# Patient Record
Sex: Female | Born: 1985 | Race: Black or African American | Hispanic: No | Marital: Single | Smoking: Never smoker
Health system: Southern US, Community
[De-identification: ages and names within clinical notes are randomized; demographics above are authoritative.]

## PROBLEM LIST (undated history)

## (undated) HISTORY — PX: NO PAST SURGERIES: SHX2092

---

## 2014-10-11 LAB — HM PAP SMEAR: HM Pap smear: NORMAL

## 2015-07-04 ENCOUNTER — Ambulatory Visit
Admission: RE | Admit: 2015-07-04 | Discharge: 2015-07-04 | Disposition: A | Discharge status: Home / Self Care | Payer: No Typology Code available for payment source | Source: Ambulatory Visit | Attending: Infectious Disease | Admitting: Infectious Disease

## 2015-07-04 ENCOUNTER — Other Ambulatory Visit: Payer: Self-pay | Admitting: Infectious Disease

## 2015-07-04 DIAGNOSIS — Z111 Encounter for screening for respiratory tuberculosis: Secondary | ICD-10-CM

## 2015-07-15 ENCOUNTER — Encounter: Payer: Self-pay | Admitting: Behavioral Health

## 2015-07-15 ENCOUNTER — Telehealth: Payer: Self-pay | Admitting: Behavioral Health

## 2015-07-15 NOTE — Telephone Encounter (Signed)
Pre-Visit Call completed with patient and chart updated.   Pre-Visit Info documented in Specialty Comments under [employer].    

## 2015-07-16 ENCOUNTER — Ambulatory Visit (HOSPITAL_BASED_OUTPATIENT_CLINIC_OR_DEPARTMENT_OTHER)
Admission: RE | Admit: 2015-07-16 | Discharge: 2015-07-16 | Disposition: A | Discharge status: Home / Self Care | Payer: BC Managed Care – PPO | Source: Ambulatory Visit | Attending: Physician Assistant | Admitting: Physician Assistant

## 2015-07-16 ENCOUNTER — Ambulatory Visit (INDEPENDENT_AMBULATORY_CARE_PROVIDER_SITE_OTHER): Payer: BC Managed Care – PPO | Admitting: Physician Assistant

## 2015-07-16 ENCOUNTER — Encounter: Payer: Self-pay | Admitting: Physician Assistant

## 2015-07-16 VITALS — BP 110/80 | HR 103 | Temp 99.9°F | Resp 16 | Ht 64.25 in | Wt 146.6 lb

## 2015-07-16 DIAGNOSIS — J189 Pneumonia, unspecified organism: Secondary | ICD-10-CM

## 2015-07-16 DIAGNOSIS — R0789 Other chest pain: Secondary | ICD-10-CM

## 2015-07-16 DIAGNOSIS — M67912 Unspecified disorder of synovium and tendon, left shoulder: Secondary | ICD-10-CM

## 2015-07-16 DIAGNOSIS — R079 Chest pain, unspecified: Secondary | ICD-10-CM | POA: Diagnosis present

## 2015-07-16 DIAGNOSIS — R509 Fever, unspecified: Secondary | ICD-10-CM

## 2015-07-16 LAB — POCT INFLUENZA A/B
INFLUENZA A, POC: NEGATIVE
INFLUENZA B, POC: NEGATIVE

## 2015-07-16 MED ORDER — DOXYCYCLINE HYCLATE 100 MG PO CAPS: 100.0000 mg | ORAL_CAPSULE | Freq: Two times a day (BID) | ORAL | Status: DC

## 2015-07-16 MED ORDER — HYDROCODONE-HOMATROPINE 5-1.5 MG/5ML PO SYRP: 5.0000 mL | ORAL_SOLUTION | Freq: Three times a day (TID) | ORAL | Status: DC | PRN

## 2015-07-16 MED ORDER — MELOXICAM 15 MG PO TABS: 15.0000 mg | ORAL_TABLET | Freq: Every day | ORAL | Status: DC
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
## 2015-07-16 NOTE — Patient Instructions (Signed)
Please go downstairs for imaging. I will call you with your results.  Stay well hydrated and get plenty of rest.  Salt water gargles and saline nasal spray will be beneficial. Take antibiotic as directed. Use Tussionex as directed for cough. Return to clinic if symptoms are not resolving.  The mobic given will help with fever as well as your shoulder.  Take daily with food over the next week. Limit heavy lifting.  Call if symptoms are not continuing to improve.

## 2015-07-16 NOTE — Progress Notes (Signed)
Pre visit review using our clinic review tool, if applicable. No additional management support is needed unless otherwise documented below in the visit note. 

## 2015-07-17 ENCOUNTER — Telehealth: Payer: Self-pay

## 2015-07-17 ENCOUNTER — Other Ambulatory Visit: Payer: Self-pay | Admitting: Physician Assistant

## 2015-07-17 MED ORDER — CLARITHROMYCIN 250 MG PO TABS: 250.0000 mg | ORAL_TABLET | Freq: Two times a day (BID) | ORAL | Status: DC

## 2015-07-17 NOTE — Telephone Encounter (Signed)
Called patient with results of XR. Advised of Pneumonia diagnosis. Advised Biaxin 250 mg will be called in to her pharmacy and to stop Doxycicline. Patient agreed. Patient would like to know if she needs to be out of work for a few more days. Biaxin ordered.

## 2015-07-17 NOTE — Telephone Encounter (Signed)
I would recommend she stay out the rest of the week to let her body heal. Ok to write note for her.

## 2015-07-17 NOTE — Telephone Encounter (Signed)
Patient informed work note ready for pick up.

## 2015-07-21 DIAGNOSIS — J189 Pneumonia, unspecified organism: Secondary | ICD-10-CM | POA: Insufficient documentation

## 2015-07-21 DIAGNOSIS — M67919 Unspecified disorder of synovium and tendon, unspecified shoulder: Secondary | ICD-10-CM | POA: Insufficient documentation

## 2015-07-21 NOTE — Assessment & Plan Note (Signed)
CXR obtained to to severity of symptoms and concerning lung exam. X-ray positive for CAP. Rx Biaxin 250 mg BID x 7 days. Increase fluids. Rx Hycodan syrup. Follow-up 1 week.
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
## 2015-07-25 ENCOUNTER — Encounter: Payer: Self-pay | Admitting: Physician Assistant

## 2015-07-25 ENCOUNTER — Ambulatory Visit (INDEPENDENT_AMBULATORY_CARE_PROVIDER_SITE_OTHER): Payer: BC Managed Care – PPO | Admitting: Physician Assistant

## 2015-07-25 VITALS — BP 110/70 | HR 55 | Temp 98.3°F | Resp 16 | Ht 64.0 in | Wt 148.1 lb

## 2015-07-25 DIAGNOSIS — J189 Pneumonia, unspecified organism: Secondary | ICD-10-CM | POA: Diagnosis not present

## 2015-07-25 MED ORDER — CETIRIZINE HCL 10 MG PO TABS: 10.0000 mg | ORAL_TABLET | Freq: Every day | ORAL | Status: DC

## 2015-07-25 NOTE — Assessment & Plan Note (Signed)
Resolved clinically. Encouraged continuation of supportive measures. Alarm signs/symptoms discussed with patient. Follow-up PRN if any symptoms recur.
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
## 2015-07-25 NOTE — Progress Notes (Signed)
Pre visit review using our clinic review tool, if applicable. No additional management support is needed unless otherwise documented below in the visit note/SLS  

## 2015-07-25 NOTE — Patient Instructions (Signed)
Please stay well hydrated and get plenty of rest. Resume a zyrtec nightly to help with allergies. Your lung exam is great so the pneumonia has clinically resolved. No need for follow-up as long as you are doing well.  If you notice any return of fever or productive cough, please come see [place] immediately.

## 2016-02-03 ENCOUNTER — Encounter: Payer: Self-pay | Admitting: Physician Assistant

## 2016-02-03 ENCOUNTER — Ambulatory Visit (INDEPENDENT_AMBULATORY_CARE_PROVIDER_SITE_OTHER): Payer: BC Managed Care – PPO | Admitting: Physician Assistant

## 2016-02-03 VITALS — BP 108/70 | HR 61 | Temp 97.9°F | Ht 64.0 in | Wt 142.8 lb

## 2016-02-03 DIAGNOSIS — K219 Gastro-esophageal reflux disease without esophagitis: Secondary | ICD-10-CM | POA: Diagnosis not present

## 2016-02-03 NOTE — Progress Notes (Signed)
Pre visit review using our clinic review tool, if applicable. No additional management support is needed unless otherwise documented below in the visit note. 

## 2016-02-03 NOTE — Patient Instructions (Signed)
Please continue Prilosec daily. Increase fluids and follow the diet below. You will be contacted by GI for further assessment of these chronic symptoms.  Food Choices for Gastroesophageal Reflux Disease, Adult When you have gastroesophageal reflux disease (GERD), the foods you eat and your eating habits are very important. Choosing the right foods can help ease the discomfort of GERD. WHAT GENERAL GUIDELINES DO I NEED TO FOLLOW?  Choose fruits, vegetables, whole grains, low-fat dairy products, and low-fat meat, fish, and poultry.  Limit fats such as oils, salad dressings, butter, nuts, and avocado.  Keep a food diary to identify foods that cause symptoms.  Avoid foods that cause reflux. These may be different for different people.  Eat frequent small meals instead of three large meals each day.  Eat your meals slowly, in a relaxed setting.  Limit fried foods.  Cook foods using methods other than frying.  Avoid drinking alcohol.  Avoid drinking large amounts of liquids with your meals.  Avoid bending over or lying down until 2-3 hours after eating. WHAT FOODS ARE NOT RECOMMENDED? The following are some foods and drinks that may worsen your symptoms: Vegetables Tomatoes. Tomato juice. Tomato and spaghetti sauce. Chili peppers. Onion and garlic. Horseradish. Fruits Oranges, grapefruit, and lemon (fruit and juice). Meats High-fat meats, fish, and poultry. This includes hot dogs, ribs, ham, sausage, salami, and bacon. Dairy Whole milk and chocolate milk. Sour cream. Cream. Butter. Ice cream. Cream cheese.  Beverages Coffee and tea, with or without caffeine. Carbonated beverages or energy drinks. Condiments Hot sauce. Barbecue sauce.  Sweets/Desserts Chocolate and cocoa. Donuts. Peppermint and spearmint. Fats and Oils High-fat foods, including [place] fries and potato chips. Other Vinegar. Strong spices, such as black pepper, white pepper, red pepper, cayenne, curry powder,  cloves, ginger, and chili powder. The items listed above may not be a complete list of foods and beverages to avoid. Contact your dietitian for more information.   This information is not intended to replace advice given to you by your health care provider. Make sure you discuss any questions you have with your health care provider.   Document Released: 09/27/2005 Document Revised: 10/18/2014 Document Reviewed: 08/01/2013 Elsevier Interactive Patient Education [employer].
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
## 2016-02-11 ENCOUNTER — Telehealth: Payer: Self-pay | Admitting: Physician Assistant

## 2016-02-11 NOTE — Telephone Encounter (Signed)
Received from [facility] forward 16 pages to Raiford Noble PA

## 2016-02-24 ENCOUNTER — Encounter: Payer: Self-pay | Admitting: Physician Assistant

## 2016-02-25 ENCOUNTER — Other Ambulatory Visit: Payer: Self-pay | Admitting: Physician Assistant

## 2016-02-25 MED ORDER — OMEPRAZOLE 40 MG PO CPDR: 40.0000 mg | DELAYED_RELEASE_CAPSULE | Freq: Every day | ORAL | Status: DC

## 2016-03-26 NOTE — Telephone Encounter (Signed)
Pt called in to follow up on paperwork. She says that she intended for records to be sent to [facility] instead of her PCP. Pt is requesting a FU for confirmation .     CB: [phone]

## 2016-03-31 NOTE — Telephone Encounter (Signed)
I have not seen these records.

## 2016-04-14 ENCOUNTER — Telehealth: Payer: Self-pay | Admitting: Gastroenterology

## 2016-04-14 NOTE — Telephone Encounter (Signed)
Received GI records and placed on Dr. Doyne Keel desk for review. Patient states that the only reason why she is transferring is that she would like all of her doctors to be under one "umbrella"

## 2016-04-20 ENCOUNTER — Encounter: Payer: Self-pay | Admitting: Gastroenterology

## 2016-04-20 ENCOUNTER — Telehealth: Payer: Self-pay | Admitting: Gastroenterology

## 2016-04-20 NOTE — Telephone Encounter (Signed)
Rec'd from [facility] forwarded 3 pages to Dr. Havery Moros

## 2016-04-20 NOTE — Telephone Encounter (Signed)
Dr. Armbruster reviewed records and has accepted patient. Ok to schedule OV. Appointment scheduled. ° °

## 2016-06-29 ENCOUNTER — Encounter: Payer: Self-pay | Admitting: Gastroenterology

## 2016-06-29 ENCOUNTER — Ambulatory Visit: Payer: BC Managed Care – PPO | Admitting: Gastroenterology

## 2016-06-29 ENCOUNTER — Ambulatory Visit (INDEPENDENT_AMBULATORY_CARE_PROVIDER_SITE_OTHER): Payer: BC Managed Care – PPO | Admitting: Gastroenterology

## 2016-06-29 VITALS — BP 106/62 | HR 58 | Ht 63.5 in | Wt 145.0 lb

## 2016-06-29 DIAGNOSIS — R140 Abdominal distension (gaseous): Secondary | ICD-10-CM | POA: Diagnosis not present

## 2016-06-29 DIAGNOSIS — R1084 Generalized abdominal pain: Secondary | ICD-10-CM

## 2016-06-29 MED ORDER — DICYCLOMINE HCL 10 MG PO CAPS: 10.0000 mg | ORAL_CAPSULE | Freq: Three times a day (TID) | ORAL | 0 refills | Status: DC | PRN
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
## 2016-06-29 NOTE — Patient Instructions (Signed)
If you are age 28 or older, your body mass index should be between 23-30. Your Body mass index is 25.28 kg/m. If this is out of the aforementioned range listed, please consider follow up with your Primary Care Provider.  If you are age 71 or younger, your body mass index should be between 19-25. Your Body mass index is 25.28 kg/m. If this is out of the aformentioned range listed, please consider follow up with your Primary Care Provider.   We have sent the following medications to your pharmacy for you to pick up at your convenience: Bentyl 10mg  1-2 tablets every 8 hours as needed.  We have given you a copy of the low FODMAP diet.

## 2016-07-07 ENCOUNTER — Telehealth: Payer: Self-pay | Admitting: Gastroenterology

## 2016-07-07 NOTE — Telephone Encounter (Signed)
Labs from prior workup received:  Fecal occult blood negative - 01/08/16 Celiac panel negative - 01/05/16  Patient should carry out plan as previously discussed, and follow up as needed if symptoms persist.

## 2016-09-21 ENCOUNTER — Ambulatory Visit (INDEPENDENT_AMBULATORY_CARE_PROVIDER_SITE_OTHER): Payer: BC Managed Care – PPO | Admitting: Family Medicine

## 2016-09-21 ENCOUNTER — Encounter: Payer: Self-pay | Admitting: Family Medicine

## 2016-09-21 VITALS — BP 120/78 | HR 68 | Temp 98.0°F | Resp 16 | Ht 63.5 in | Wt 143.4 lb

## 2016-09-21 DIAGNOSIS — J111 Influenza due to unidentified influenza virus with other respiratory manifestations: Secondary | ICD-10-CM | POA: Diagnosis not present

## 2016-09-21 DIAGNOSIS — R1084 Generalized abdominal pain: Secondary | ICD-10-CM

## 2016-09-21 LAB — POCT URINALYSIS DIPSTICK
Bilirubin, UA: NEGATIVE
Glucose, UA: NEGATIVE
KETONES UA: NEGATIVE
Leukocytes, UA: NEGATIVE
Nitrite, UA: NEGATIVE
PROTEIN UA: NEGATIVE
RBC UA: NEGATIVE
SPEC GRAV UA: 1.015
Urobilinogen, UA: 0.2
pH, UA: 6.5

## 2016-09-21 MED ORDER — OSELTAMIVIR PHOSPHATE 75 MG PO CAPS: 75.0000 mg | ORAL_CAPSULE | Freq: Two times a day (BID) | ORAL | 0 refills | Status: DC

## 2016-09-21 MED FILL — OSELTAMIVIR PHOS 75 MG CAP: 75 | 5 days supply | Qty: 10 | Fill #0

## 2016-09-21 NOTE — Patient Instructions (Signed)
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
## 2016-09-22 ENCOUNTER — Ambulatory Visit: Payer: BC Managed Care – PPO | Admitting: Medical

## 2016-09-22 LAB — COMPREHENSIVE METABOLIC PANEL
ALT: 12 U/L (ref 0–35)
AST: 19 U/L (ref 0–37)
Albumin: 4.2 g/dL (ref 3.5–5.2)
Alkaline Phosphatase: 71 U/L (ref 39–117)
BILIRUBIN TOTAL: 0.4 mg/dL (ref 0.2–1.2)
BUN: 10 mg/dL (ref 6–23)
CO2: 30 mEq/L (ref 19–32)
CREATININE: 0.72 mg/dL (ref 0.40–1.20)
Calcium: 9.5 mg/dL (ref 8.4–10.5)
Chloride: 102 mEq/L (ref 96–112)
GFR: 124 mL/min (ref >60.00)
Glucose, Bld: 74 mg/dL (ref 70–99)
Potassium: 4.2 mEq/L (ref 3.5–5.1)
SODIUM: 138 meq/L (ref 135–145)
Total Protein: 7.3 g/dL (ref 6.0–8.3)

## 2016-09-22 LAB — CBC WITH DIFFERENTIAL/PLATELET
BASOS ABS: 0 10*3/uL (ref 0.0–0.1)
Basophils Relative: 0.6 % (ref 0.0–3.0)
EOS ABS: 0.1 10*3/uL (ref 0.0–0.7)
Eosinophils Relative: 1.4 % (ref 0.0–5.0)
HEMATOCRIT: 38.9 % (ref 36.0–46.0)
HEMOGLOBIN: 13.3 g/dL (ref 12.0–15.0)
LYMPHS PCT: 24.3 % (ref 12.0–46.0)
Lymphs Abs: 1.2 10*3/uL (ref 0.7–4.0)
MCHC: 34.2 g/dL (ref 30.0–36.0)
MCV: 89.8 fl (ref 78.0–100.0)
Monocytes Absolute: 0.6 10*3/uL (ref 0.1–1.0)
Monocytes Relative: 13.6 % — ABNORMAL HIGH (ref 3.0–12.0)
Neutro Abs: 2.9 10*3/uL (ref 1.4–7.7)
Neutrophils Relative %: 60.1 % (ref 43.0–77.0)
Platelets: 236 10*3/uL (ref 150.0–400.0)
RBC: 4.33 Mil/uL (ref 3.87–5.11)
RDW: 13.3 % (ref 11.5–15.5)
WBC: 4.8 10*3/uL (ref 4.0–10.5)

## 2016-11-09 ENCOUNTER — Ambulatory Visit: Payer: BC Managed Care – PPO | Admitting: Gastroenterology

## 2016-12-20 ENCOUNTER — Telehealth: Payer: Self-pay | Admitting: Physician Assistant

## 2016-12-20 NOTE — Telephone Encounter (Signed)
Caller name: Relationship to patient: Can be reached: Pharmacy:  Reason for call: Patient has a list of shots she needs prior to going out of the country. Needs to know which ones she can get here, how much they cost, and which ones she needs  to go to the health dept to get. Yellow Fever Hep A&B Meningitis Rabies Malaria Typhoid Fever MMR Polio

## 2016-12-20 NOTE — Telephone Encounter (Signed)
Would recommend we refer her to the [facility] clinic as they can determine what is actually necessary for where she will be going and the environment she will be in. They would be able to give all of the necessary vaccines, whereas we would only have some available to [place].   Please give her the number so she can get set up with them.

## 2016-12-21 ENCOUNTER — Encounter: Payer: Self-pay | Admitting: Emergency Medicine

## 2016-12-21 NOTE — Telephone Encounter (Signed)
LMOVM and sent My chart message as well with travel advice.

## 2017-03-08 ENCOUNTER — Telehealth: Payer: Self-pay | Admitting: Physician Assistant

## 2017-03-08 DIAGNOSIS — Z7184 Encounter for health counseling related to travel: Secondary | ICD-10-CM

## 2017-03-08 NOTE — Telephone Encounter (Signed)
Referral has been faxed, confirmation received.  Patient has been called and notified that referral was faxed.

## 2017-03-08 NOTE — Telephone Encounter (Signed)
This must be a new protocol as this has never been needed before.  Referral placed for patient. Please fax and let patient know. Thank you!

## 2017-03-08 NOTE — Telephone Encounter (Signed)
Patient is currently at the health department trying to get immunizations for her upcoming travel out of the country.  She is being advised she can not get the immunization there without a referral from her pcp.  Please enter referral and I will fax it to them.    [phone] Attn:  International Travel Nurse

## 2017-05-20 ENCOUNTER — Ambulatory Visit (INDEPENDENT_AMBULATORY_CARE_PROVIDER_SITE_OTHER): Payer: BC Managed Care – PPO | Admitting: Family

## 2017-05-20 ENCOUNTER — Encounter: Payer: Self-pay | Admitting: Family

## 2017-05-20 VITALS — BP 110/67 | HR 62 | Temp 98.1°F | Resp 16 | Ht 63.5 in | Wt 151.2 lb

## 2017-05-20 DIAGNOSIS — M25562 Pain in left knee: Secondary | ICD-10-CM | POA: Diagnosis not present

## 2017-05-20 MED ORDER — MELOXICAM 7.5 MG PO TABS: 7.5000 mg | ORAL_TABLET | Freq: Every day | ORAL | 0 refills | Status: DC

## 2017-05-20 NOTE — Patient Instructions (Signed)
Please begin meloxicam once daily for your knee pain. Call if your pain worsens or if pain does not improve in 2 weeks and we will refer you to sports medicine.
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
## 2017-05-31 ENCOUNTER — Encounter: Payer: Self-pay | Admitting: Family

## 2017-05-31 DIAGNOSIS — M25562 Pain in left knee: Secondary | ICD-10-CM

## 2017-06-01 NOTE — Telephone Encounter (Signed)
Ok to refer to sports medicine per Melissa's note. TY.

## 2017-06-03 ENCOUNTER — Ambulatory Visit (INDEPENDENT_AMBULATORY_CARE_PROVIDER_SITE_OTHER): Payer: BC Managed Care – PPO | Admitting: Family Medicine

## 2017-06-03 ENCOUNTER — Encounter: Payer: Self-pay | Admitting: Family Medicine

## 2017-06-03 DIAGNOSIS — M25562 Pain in left knee: Secondary | ICD-10-CM | POA: Diagnosis not present

## 2017-06-03 NOTE — Patient Instructions (Signed)
You have popliteus spasms/strain. Tylenol, aleve, or ibuprofen only if needed. Heat 15 minutes at a time 3-4 times a day may help. A compression sleeve for the knee may help also. Knee locking/unlocking, Leg curls, hamstring swings, running lunges - add 2 pound weight with time if these are too easy. 3 sets of 10 once or twice a day. Consider physical therapy as well. Follow up with me in 6 weeks or as needed.Marland Kitchen

## 2017-06-04 DIAGNOSIS — M25562 Pain in left knee: Secondary | ICD-10-CM | POA: Insufficient documentation

## 2017-06-04 NOTE — Assessment & Plan Note (Signed)
Exam and ultrasound reassuring.  Consistent with popliteus strain/spasms.  Tylenol, aleve or ibuprofen if needed.  Heat, compression sleeve.  Shown home exercises to do daily.  Consider physical therapy if not improving as expected.  Follow up in 6 weeks or prn.
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
## 2017-07-14 ENCOUNTER — Ambulatory Visit: Payer: BC Managed Care – PPO | Admitting: Family Medicine

## 2017-08-11 ENCOUNTER — Ambulatory Visit: Payer: BC Managed Care – PPO | Admitting: Family Medicine

## 2017-08-22 ENCOUNTER — Ambulatory Visit: Payer: BC Managed Care – PPO | Admitting: Family Medicine

## 2017-08-22 ENCOUNTER — Encounter: Payer: Self-pay | Admitting: Family Medicine

## 2017-08-22 DIAGNOSIS — M25562 Pain in left knee: Secondary | ICD-10-CM

## 2017-08-22 NOTE — Patient Instructions (Signed)
Your knee pain is either due to popliteus spasms or a small meniscus tear. Look into how much it would cost you to have an MRI and let us know if you want to go ahead with this or if you want to do physical therapy.
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
## 2017-08-22 NOTE — Assessment & Plan Note (Signed)
patient has gone 2 1/2 months without improvement of knee pain.  Reports odd sensation in knee like it may buckle at times.  Exam is reassuring but discussed small meniscus tear is a possibility vs popliteus spasms which I would expect to be improved by now.  Plan to go ahead with MRI but she will check with her insurance about this first and let us know if she wants to go ahead.  Tylenol, ibuprofen, home exercises in meantime.

## 2017-09-21 ENCOUNTER — Ambulatory Visit: Payer: BC Managed Care – PPO | Admitting: Family

## 2017-09-21 ENCOUNTER — Encounter: Payer: Self-pay | Admitting: Family

## 2017-09-21 VITALS — BP 116/65 | HR 63 | Temp 98.5°F | Resp 16 | Ht 63.5 in | Wt 152.0 lb

## 2017-09-21 DIAGNOSIS — J309 Allergic rhinitis, unspecified: Secondary | ICD-10-CM | POA: Diagnosis not present

## 2017-09-21 NOTE — Patient Instructions (Signed)
Please add claritin or zyrtec once daily. Add mucinex 600mg  twice daily. Call if new/worsening symptoms or if not improved in 2-3 days.
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
## 2018-03-01 ENCOUNTER — Encounter: Payer: Self-pay | Admitting: Physician Assistant

## 2018-04-28 ENCOUNTER — Telehealth: Payer: Self-pay | Admitting: *Deleted

## 2018-04-28 NOTE — Telephone Encounter (Signed)
Ok with me 

## 2018-04-28 NOTE — Telephone Encounter (Signed)
Copied from [place] [phone]. Topic: General - Other >> Apr 28, 2018  9:32 AM Yvette Rack wrote: Reason for CRM: pt would like to switch providers from [facility] to [facility] she states that its closer to her home

## 2018-05-03 NOTE — Telephone Encounter (Signed)
Alexis Brooks -- it is possible that a cpe can be done at that visit but I do not guarantee that to the pt. If the pt has no concerns at all and is otherwise healthy; no chronic medical conditions or acute concerns that need to be addressed, then a cpe may be performed at the initial visit. If you can let the pt know please. Thanks!

## 2018-05-03 NOTE — Telephone Encounter (Signed)
Patient is requesting CPE, schedule for TOC on 07/03/18, ok for CPE at that visit?

## 2018-07-03 ENCOUNTER — Other Ambulatory Visit (HOSPITAL_COMMUNITY)
Admission: RE | Admit: 2018-07-03 | Discharge: 2018-07-03 | Disposition: A | Discharge status: Home / Self Care | Payer: BC Managed Care – PPO | Source: Ambulatory Visit | Attending: Family | Admitting: Family

## 2018-07-03 ENCOUNTER — Encounter: Payer: Self-pay | Admitting: Family

## 2018-07-03 ENCOUNTER — Ambulatory Visit (INDEPENDENT_AMBULATORY_CARE_PROVIDER_SITE_OTHER): Payer: BC Managed Care – PPO | Admitting: Family

## 2018-07-03 VITALS — BP 100/72 | HR 62 | Temp 98.5°F | Resp 16 | Ht 64.0 in | Wt 156.6 lb

## 2018-07-03 DIAGNOSIS — Z0000 Encounter for general adult medical examination without abnormal findings: Secondary | ICD-10-CM | POA: Insufficient documentation

## 2018-07-03 MED ORDER — FEXOFENADINE HCL 180 MG PO TABS: 180.0000 mg | ORAL_TABLET | Freq: Every day | ORAL | 11 refills | Status: AC

## 2018-07-03 NOTE — Patient Instructions (Addendum)
Continue healthy diet, exercise and weight loss efforts.  Please complete lab work prior to leaving.
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
## 2018-07-04 LAB — BASIC METABOLIC PANEL
BUN: 13 mg/dL (ref 6–23)
CO2: 30 meq/L (ref 19–32)
Calcium: 9.3 mg/dL (ref 8.4–10.5)
Chloride: 100 mEq/L (ref 96–112)
Creatinine, Ser: 0.77 mg/dL (ref 0.40–1.20)
GFR: 113.33 mL/min (ref >60.00)
GLUCOSE: 84 mg/dL (ref 70–99)
POTASSIUM: 4.3 meq/L (ref 3.5–5.1)
SODIUM: 137 meq/L (ref 135–145)

## 2018-07-04 LAB — URINALYSIS, ROUTINE W REFLEX MICROSCOPIC
Bilirubin Urine: NEGATIVE
Hgb urine dipstick: NEGATIVE
Ketones, ur: NEGATIVE
LEUKOCYTES UA: NEGATIVE
Nitrite: NEGATIVE
RBC / HPF: NONE SEEN (ref 0–?)
SPECIFIC GRAVITY, URINE: 1.01 (ref 1.000–1.030)
TOTAL PROTEIN, URINE-UPE24: NEGATIVE
Urine Glucose: NEGATIVE
Urobilinogen, UA: 0.2 (ref 0.0–1.0)
WBC, UA: NONE SEEN (ref 0–?)
pH: 7 (ref 5.0–8.0)

## 2018-07-04 LAB — CBC WITH DIFFERENTIAL/PLATELET
Basophils Absolute: 0.1 10*3/uL (ref 0.0–0.1)
Basophils Relative: 1.3 % (ref 0.0–3.0)
EOS PCT: 3.7 % (ref 0.0–5.0)
Eosinophils Absolute: 0.2 10*3/uL (ref 0.0–0.7)
HCT: 38.6 % (ref 36.0–46.0)
Hemoglobin: 12.9 g/dL (ref 12.0–15.0)
LYMPHS ABS: 2.2 10*3/uL (ref 0.7–4.0)
Lymphocytes Relative: 44.1 % (ref 12.0–46.0)
MCHC: 33.4 g/dL (ref 30.0–36.0)
MCV: 91.5 fl (ref 78.0–100.0)
MONO ABS: 0.4 10*3/uL (ref 0.1–1.0)
MONOS PCT: 8.3 % (ref 3.0–12.0)
NEUTROS ABS: 2.1 10*3/uL (ref 1.4–7.7)
NEUTROS PCT: 42.6 % — AB (ref 43.0–77.0)
PLATELETS: 238 10*3/uL (ref 150.0–400.0)
RBC: 4.22 Mil/uL (ref 3.87–5.11)
RDW: 13.4 % (ref 11.5–15.5)
WBC: 5 10*3/uL (ref 4.0–10.5)

## 2018-07-04 LAB — HEPATIC FUNCTION PANEL
ALBUMIN: 4.2 g/dL (ref 3.5–5.2)
ALT: 10 U/L (ref 0–35)
AST: 15 U/L (ref 0–37)
Alkaline Phosphatase: 72 U/L (ref 39–117)
Bilirubin, Direct: 0.1 mg/dL (ref 0.0–0.3)
TOTAL PROTEIN: 7.2 g/dL (ref 6.0–8.3)
Total Bilirubin: 0.5 mg/dL (ref 0.2–1.2)

## 2018-07-04 LAB — LIPID PANEL
Cholesterol: 144 mg/dL (ref 0–200)
HDL: 56.1 mg/dL (ref >39.00)
LDL Cholesterol: 77 mg/dL (ref 0–99)
NONHDL: 87.72
Total CHOL/HDL Ratio: 3
Triglycerides: 55 mg/dL (ref 0.0–149.0)
VLDL: 11 mg/dL (ref 0.0–40.0)

## 2018-07-04 LAB — TSH: TSH: 0.85 u[IU]/mL (ref 0.35–4.50)

## 2018-07-05 LAB — CYTOLOGY - PAP: Diagnosis: NEGATIVE

## 2018-07-14 ENCOUNTER — Other Ambulatory Visit: Payer: Self-pay | Admitting: Rehabilitation

## 2018-07-14 DIAGNOSIS — M5431 Sciatica, right side: Secondary | ICD-10-CM

## 2018-07-14 DIAGNOSIS — M5432 Sciatica, left side: Principal | ICD-10-CM

## 2018-07-19 ENCOUNTER — Ambulatory Visit
Admission: RE | Admit: 2018-07-19 | Discharge: 2018-07-19 | Disposition: A | Discharge status: Home / Self Care | Payer: BC Managed Care – PPO | Source: Ambulatory Visit | Attending: Rehabilitation | Admitting: Rehabilitation

## 2018-07-19 DIAGNOSIS — M5432 Sciatica, left side: Principal | ICD-10-CM

## 2018-07-19 DIAGNOSIS — M5431 Sciatica, right side: Secondary | ICD-10-CM

## 2018-08-16 ENCOUNTER — Other Ambulatory Visit: Payer: Self-pay | Admitting: Rehabilitation

## 2018-08-16 DIAGNOSIS — M899 Disorder of bone, unspecified: Secondary | ICD-10-CM

## 2018-08-30 ENCOUNTER — Ambulatory Visit
Admission: RE | Admit: 2018-08-30 | Discharge: 2018-08-30 | Disposition: A | Discharge status: Home / Self Care | Payer: BC Managed Care – PPO | Source: Ambulatory Visit | Attending: Rehabilitation | Admitting: Rehabilitation

## 2018-08-30 DIAGNOSIS — M899 Disorder of bone, unspecified: Secondary | ICD-10-CM

## 2018-09-06 ENCOUNTER — Telehealth: Payer: Self-pay | Admitting: Family

## 2018-09-06 NOTE — Telephone Encounter (Signed)
Received message from Dr. Patrica Duel who is a rehab specialist working with pt for her chronic back pain.  Reports that he did an MRI of the spine which showed a lesion which is concerning for malignancy. He will reach out to the patient and will place referral to Dr. Estanislado Pandy IR for biopsy of this lesion.

## 2018-09-15 ENCOUNTER — Other Ambulatory Visit (HOSPITAL_COMMUNITY): Payer: Self-pay | Admitting: Interventional Radiology

## 2018-09-15 ENCOUNTER — Telehealth (HOSPITAL_COMMUNITY): Payer: Self-pay | Admitting: Radiology

## 2018-09-15 DIAGNOSIS — D480 Neoplasm of uncertain behavior of bone and articular cartilage: Secondary | ICD-10-CM

## 2018-09-15 NOTE — Telephone Encounter (Signed)
Called pt, left VM for her to call to set up bone bx with Deveshwar. JM

## 2018-09-18 ENCOUNTER — Telehealth: Payer: Self-pay | Admitting: Family

## 2018-09-18 ENCOUNTER — Telehealth (HOSPITAL_COMMUNITY): Payer: Self-pay | Admitting: Radiology

## 2018-09-18 NOTE — Telephone Encounter (Signed)
Spoke to patient re: abnormal MRI. She states that she is to call back Dr. Arlean Hopping office tomorrow to schedule biopsy. I have advised her to follow up in the office with me 1 week after her biopsy. Pt verbalizes understanding.

## 2018-09-18 NOTE — Telephone Encounter (Signed)
Called pt and she states that she will call us back tomorrow to schedule her bone biopsy. JM

## 2018-09-27 ENCOUNTER — Other Ambulatory Visit: Payer: Self-pay | Admitting: Radiology

## 2018-09-29 ENCOUNTER — Other Ambulatory Visit (HOSPITAL_COMMUNITY): Payer: Self-pay | Admitting: Interventional Radiology

## 2018-09-29 ENCOUNTER — Ambulatory Visit (HOSPITAL_COMMUNITY)
Admission: RE | Admit: 2018-09-29 | Discharge: 2018-09-29 | Disposition: A | Discharge status: Home / Self Care | Payer: BC Managed Care – PPO | Source: Ambulatory Visit | Attending: Interventional Radiology | Admitting: Interventional Radiology

## 2018-09-29 ENCOUNTER — Encounter (HOSPITAL_COMMUNITY): Payer: Self-pay

## 2018-09-29 DIAGNOSIS — Z87828 Personal history of other (healed) physical injury and trauma: Secondary | ICD-10-CM | POA: Diagnosis not present

## 2018-09-29 DIAGNOSIS — G8929 Other chronic pain: Secondary | ICD-10-CM | POA: Diagnosis not present

## 2018-09-29 DIAGNOSIS — D169 Benign neoplasm of bone and articular cartilage, unspecified: Secondary | ICD-10-CM | POA: Insufficient documentation

## 2018-09-29 DIAGNOSIS — M545 Low back pain: Secondary | ICD-10-CM | POA: Insufficient documentation

## 2018-09-29 DIAGNOSIS — D480 Neoplasm of uncertain behavior of bone and articular cartilage: Secondary | ICD-10-CM

## 2018-09-29 HISTORY — PX: IR FLUORO GUIDED NEEDLE PLC ASPIRATION/INJECTION LOC: IMG2395

## 2018-09-29 LAB — CBC
HCT: 43.4 % (ref 36.0–46.0)
Hemoglobin: 13.8 g/dL (ref 12.0–15.0)
MCH: 30.4 pg (ref 26.0–34.0)
MCHC: 31.8 g/dL (ref 30.0–36.0)
MCV: 95.6 fL (ref 80.0–100.0)
Platelets: 248 10*3/uL (ref 150–400)
RBC: 4.54 MIL/uL (ref 3.87–5.11)
RDW: 12.7 % (ref 11.5–15.5)
WBC: 4.4 10*3/uL (ref 4.0–10.5)
nRBC: 0 % (ref 0.0–0.2)

## 2018-09-29 LAB — PROTIME-INR
INR: 1.01
PROTHROMBIN TIME: 13.2 s (ref 11.4–15.2)

## 2018-09-29 LAB — PREGNANCY, URINE: Preg Test, Ur: NEGATIVE

## 2018-09-29 MED ORDER — SODIUM CHLORIDE 0.9 % IV SOLN: INTRAVENOUS | Status: AC

## 2018-09-29 MED ORDER — MIDAZOLAM HCL 2 MG/2ML IJ SOLN
INTRAMUSCULAR | Status: AC | PRN
  Administered 2018-09-29 (×3): 0.5 mg via INTRAVENOUS

## 2018-09-29 MED ORDER — SODIUM CHLORIDE 0.9 % IV SOLN: INTRAVENOUS | Status: DC

## 2018-09-29 MED ORDER — BUPIVACAINE HCL 0.25 % IJ SOLN
INTRAMUSCULAR | Status: AC | PRN
  Administered 2018-09-29: 10 mL

## 2018-09-29 MED ORDER — HYDROMORPHONE HCL 1 MG/ML IJ SOLN
INTRAMUSCULAR | Status: AC
  Filled 2018-09-29: qty 1

## 2018-09-29 MED ORDER — FENTANYL CITRATE (PF) 100 MCG/2ML IJ SOLN
INTRAMUSCULAR | Status: AC
  Filled 2018-09-29: qty 2

## 2018-09-29 MED ORDER — BUPIVACAINE HCL (PF) 0.5 % IJ SOLN
INTRAMUSCULAR | Status: AC
  Filled 2018-09-29: qty 30

## 2018-09-29 MED ORDER — MIDAZOLAM HCL 2 MG/2ML IJ SOLN
INTRAMUSCULAR | Status: AC
  Filled 2018-09-29: qty 2

## 2018-09-29 MED ORDER — FENTANYL CITRATE (PF) 100 MCG/2ML IJ SOLN
INTRAMUSCULAR | Status: AC | PRN
  Administered 2018-09-29 (×3): 12.5 ug via INTRAVENOUS

## 2018-09-29 NOTE — Procedures (Signed)
S/P RTS2  Fluoro guided biopsy x 2 passes. Soft bone encountered. Tissue sent for path analysis.

## 2018-09-29 NOTE — Discharge Instructions (Signed)
Percutaneous Kidney Biopsy, Care After  This sheet gives you information about how to care for yourself after your procedure. Your health care provider may also give you more specific instructions. If you have problems or questions, contact your health care provider.  What can I expect after the procedure?  After the procedure, it is common to have:   Pain or soreness near the area where the needle went through your skin (biopsy site).   Bright pink or cloudy urine for 24 hours after the procedure.  Follow these instructions at home:  Activity   Return to your normal activities as told by your health care provider. Ask your health care provider what activities are safe for you.   Do not drive for 24 hours if you were given a medicine to help you relax (sedative).   Do not lift anything that is heavier than 10 lb (4.5 kg) until your health care provider tells you that it is safe.   Avoid activities that take a lot of effort (are strenuous) until your health care provider approves. Most people will have to wait 2 weeks before returning to activities such as exercise or sexual intercourse.  General instructions     Take over-the-counter and prescription medicines only as told by your health care provider.   You may eat and drink after your procedure. Follow instructions from your health care provider about eating or drinking restrictions.   Check your biopsy site every day for signs of infection. Check for:  ? More redness, swelling, or pain.  ? More fluid or blood.  ? Warmth.  ? Pus or a bad smell.   Keep all follow-up visits as told by your health care provider. This is important.  Contact a health care provider if:   You have more redness, swelling, or pain around your biopsy site.   You have more fluid or blood coming from your biopsy site.   Your biopsy site feels warm to the touch.   You have pus or a bad smell coming from your biopsy site.   You have blood in your urine more than 24 hours after  your procedure.  Get help right away if:   You have dark red or brown urine.   You have a fever.   You are unable to urinate.   You feel burning when you urinate.   You feel faint.   You have severe pain in your abdomen or side.  This information is not intended to replace advice given to you by your health care provider. Make sure you discuss any questions you have with your health care provider.  Document Released: 05/30/2013 Document Revised: 07/09/2016 Document Reviewed: 07/09/2016  Elsevier Interactive Patient Education  2019 [employer]
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
## 2018-09-30 ENCOUNTER — Encounter (HOSPITAL_COMMUNITY): Payer: Self-pay | Admitting: Interventional Radiology

## 2018-10-13 ENCOUNTER — Encounter: Payer: Self-pay | Admitting: Family

## 2018-10-13 ENCOUNTER — Ambulatory Visit: Payer: BC Managed Care – PPO | Admitting: Family

## 2018-10-13 VITALS — BP 111/64 | HR 54 | Temp 98.4°F | Resp 16 | Ht 64.0 in | Wt 149.0 lb

## 2018-10-13 DIAGNOSIS — G8929 Other chronic pain: Secondary | ICD-10-CM

## 2018-10-13 DIAGNOSIS — R937 Abnormal findings on diagnostic imaging of other parts of musculoskeletal system: Secondary | ICD-10-CM | POA: Diagnosis not present

## 2018-10-13 DIAGNOSIS — M545 Low back pain, unspecified: Secondary | ICD-10-CM

## 2018-10-13 DIAGNOSIS — M2550 Pain in unspecified joint: Secondary | ICD-10-CM
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
## 2018-10-13 NOTE — Patient Instructions (Addendum)
Call if you decide you would like to proceed with referral to Dr. Barbaraann Barthel for possible hip involvement.  Begin back exercises as below.   Exercises The following exercises strengthen the muscles that help to support the back. They also help to keep the lower back flexible. Doing these exercises can help to prevent back pain or lessen existing pain. If you have back pain or discomfort, try doing these exercises 2-3 times each day or as told by your health care provider. When the pain goes away, do them once each day, but increase the number of times that you repeat the steps for each exercise (do more repetitions). If you do not have back pain or discomfort, do these exercises once each day or as told by your health care provider. Exercises Single Knee to Chest Repeat these steps 3-5 times for each leg: 1. Lie on your back on a firm bed or the floor with your legs extended. 2. Bring one knee to your chest. Your other leg should stay extended and in contact with the floor. 3. Hold your knee in place by grabbing your knee or thigh. 4. Pull on your knee until you feel a gentle stretch in your lower back. 5. Hold the stretch for 10-30 seconds. 6. Slowly release and straighten your leg. Pelvic Tilt Repeat these steps 5-10 times: 1. Lie on your back on a firm bed or the floor with your legs extended. 2. Bend your knees so they are pointing toward the ceiling and your feet are flat on the floor. 3. Tighten your lower abdominal muscles to press your lower back against the floor. This motion will tilt your pelvis so your tailbone points up toward the ceiling instead of pointing to your feet or the floor. 4. With gentle tension and even breathing, hold this position for 5-10 seconds. Cat-Cow Repeat these steps until your lower back becomes more flexible: 1. Get into a hands-and-knees position on a firm surface. Keep your hands under your shoulders, and keep your knees under your hips. You may place  padding under your knees for comfort. 2. Let your head hang down, and point your tailbone toward the floor so your lower back becomes rounded like the back of a cat. 3. Hold this position for 5 seconds. 4. Slowly lift your head and point your tailbone up toward the ceiling so your back forms a sagging arch like the back of a cow. 5. Hold this position for 5 seconds.  Press-Ups Repeat these steps 5-10 times: 1. Lie on your abdomen (face-down) on the floor. 2. Place your palms near your head, about shoulder-width apart. 3. While you keep your back as relaxed as possible and keep your hips on the floor, slowly straighten your arms to raise the top half of your body and lift your shoulders. Do not use your back muscles to raise your upper torso. You may adjust the placement of your hands to make yourself more comfortable. 4. Hold this position for 5 seconds while you keep your back relaxed. 5. Slowly return to lying flat on the floor.  Bridges Repeat these steps 10 times: 1. Lie on your back on a firm surface. 2. Bend your knees so they are pointing toward the ceiling and your feet are flat on the floor. 3. Tighten your buttocks muscles and lift your buttocks off of the floor until your waist is at almost the same height as your knees. You should feel the muscles working in your buttocks and the  back of your thighs. If you do not feel these muscles, slide your feet 1-2 inches farther away from your buttocks. 4. Hold this position for 3-5 seconds. 5. Slowly lower your hips to the starting position, and allow your buttocks muscles to relax completely. If this exercise is too easy, try doing it with your arms crossed over your chest. Abdominal Crunches Repeat these steps 5-10 times: 1. Lie on your back on a firm bed or the floor with your legs extended. 2. Bend your knees so they are pointing toward the ceiling and your feet are flat on the floor. 3. Cross your arms over your chest. 4. Tip your  chin slightly toward your chest without bending your neck. 5. Tighten your abdominal muscles and slowly raise your trunk (torso) high enough to lift your shoulder blades a tiny bit off of the floor. Avoid raising your torso higher than that, because it can put too much stress on your low back and it does not help to strengthen your abdominal muscles. 6. Slowly return to your starting position. Back Lifts Repeat these steps 5-10 times: 1. Lie on your abdomen (face-down) with your arms at your sides, and rest your forehead on the floor. 2. Tighten the muscles in your legs and your buttocks. 3. Slowly lift your chest off of the floor while you keep your hips pressed to the floor. Keep the back of your head in line with the curve in your back. Your eyes should be looking at the floor. 4. Hold this position for 3-5 seconds. 5. Slowly return to your starting position. Contact a health care provider if:  Your back pain or discomfort gets much worse when you do an exercise.  Your back pain or discomfort does not lessen within 2 hours after you exercise. If you have any of these problems, stop doing these exercises right away. Do not do them again unless your health care provider says that you can. Get help right away if:  You develop sudden, severe back pain. If this happens, stop doing the exercises right away. Do not do them again unless your health care provider says that you can. This information is not intended to replace advice given to you by your health care provider. Make sure you discuss any questions you have with your health care provider. Document Released: 11/04/2004 Document Revised: 01/31/2018 Document Reviewed: 11/21/2014 Elsevier Interactive Patient Education  [employer].

## 2018-10-20 ENCOUNTER — Other Ambulatory Visit: Payer: BC Managed Care – PPO

## 2018-10-20 NOTE — Addendum Note (Signed)
Addended by: Kelle Darting A on: 10/20/2018 03:33 PM   Modules accepted: Orders

## 2018-10-23 ENCOUNTER — Telehealth: Payer: Self-pay | Admitting: Family

## 2018-10-23 NOTE — Telephone Encounter (Signed)
See mychart.  

## 2018-10-24 ENCOUNTER — Telehealth: Payer: Self-pay | Admitting: Family

## 2018-10-25 NOTE — Telephone Encounter (Signed)
Pt has not read mychart message. Left message on cell requesting that she review her mychart message and get back to me.

## 2018-10-26 ENCOUNTER — Telehealth: Payer: Self-pay | Admitting: Family

## 2018-10-26 DIAGNOSIS — S32009A Unspecified fracture of unspecified lumbar vertebra, initial encounter for closed fracture: Secondary | ICD-10-CM

## 2018-10-26 DIAGNOSIS — S34109A Unspecified injury to unspecified level of lumbar spinal cord, initial encounter: Principal | ICD-10-CM

## 2018-10-26 DIAGNOSIS — Q7649 Other congenital malformations of spine, not associated with scoliosis: Secondary | ICD-10-CM

## 2018-10-26 DIAGNOSIS — M899 Disorder of bone, unspecified: Secondary | ICD-10-CM

## 2018-10-26 NOTE — Telephone Encounter (Signed)
PET order has been placed. Would you please contact pt to complete HCG prior to PET?

## 2018-10-27 ENCOUNTER — Other Ambulatory Visit: Payer: BC Managed Care – PPO

## 2018-10-27 NOTE — Telephone Encounter (Signed)
Called patient to try to arrange HCG priot to PET, she said she has questions about the PET and that she is not planning on doing it any time soon.

## 2018-10-27 NOTE — Telephone Encounter (Signed)
Left message for patient to return my call to discuss her questions.

## 2018-10-30 ENCOUNTER — Telehealth: Payer: Self-pay | Admitting: Hematology

## 2018-10-30 NOTE — Telephone Encounter (Signed)
Spoke to pt. Advised pt that PET would help [place] to rule out if there is underlying malignancy which might have been missed on biopsy.  She is hesitant to proceed at this time. She is requesting referral to oncology to discuss before she makes any decisions.  I have placed referral.

## 2018-10-30 NOTE — Telephone Encounter (Signed)
lmom to inform pt of new patient appt 1/30 at 12 pm per Dr Maylon Peppers staff message

## 2018-11-09 ENCOUNTER — Telehealth: Payer: Self-pay | Admitting: Hematology

## 2018-11-09 ENCOUNTER — Inpatient Hospital Stay: Payer: BC Managed Care – PPO | Admitting: Hematology

## 2018-11-09 ENCOUNTER — Inpatient Hospital Stay: Payer: BC Managed Care – PPO

## 2018-11-09 NOTE — Telephone Encounter (Signed)
per phone message 1/29 pt wish to cxl appts on 1/30

## 2018-11-21 ENCOUNTER — Telehealth: Payer: Self-pay | Admitting: Family

## 2018-11-21 NOTE — Telephone Encounter (Signed)
See my chart message

## 2018-11-27 ENCOUNTER — Other Ambulatory Visit (INDEPENDENT_AMBULATORY_CARE_PROVIDER_SITE_OTHER): Payer: BC Managed Care – PPO

## 2018-11-27 DIAGNOSIS — M2550 Pain in unspecified joint: Secondary | ICD-10-CM | POA: Diagnosis not present

## 2018-11-28 LAB — ANA: Anti Nuclear Antibody(ANA): NEGATIVE

## 2018-11-28 LAB — RHEUMATOID FACTOR: Rheumatoid fact SerPl-aCnc: <14 IU/mL (ref <14)

## 2018-11-28 LAB — SEDIMENTATION RATE: Sed Rate: 17 mm/h (ref 0–20)

## 2018-12-11 ENCOUNTER — Other Ambulatory Visit: Payer: Self-pay | Admitting: Hematology

## 2018-12-11 DIAGNOSIS — R937 Abnormal findings on diagnostic imaging of other parts of musculoskeletal system: Secondary | ICD-10-CM | POA: Insufficient documentation
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
## 2018-12-15 ENCOUNTER — Encounter: Payer: Self-pay | Admitting: Hematology

## 2018-12-15 ENCOUNTER — Inpatient Hospital Stay (HOSPITAL_BASED_OUTPATIENT_CLINIC_OR_DEPARTMENT_OTHER): Payer: BC Managed Care – PPO | Admitting: Hematology

## 2018-12-15 ENCOUNTER — Inpatient Hospital Stay: Payer: BC Managed Care – PPO | Attending: Hematology

## 2018-12-15 ENCOUNTER — Telehealth: Payer: Self-pay | Admitting: Hematology

## 2018-12-15 ENCOUNTER — Encounter: Payer: Self-pay | Admitting: *Deleted

## 2018-12-15 ENCOUNTER — Other Ambulatory Visit: Payer: Self-pay

## 2018-12-15 VITALS — BP 112/68 | HR 50 | Temp 98.3°F | Resp 17 | Ht 64.0 in | Wt 152.8 lb

## 2018-12-15 DIAGNOSIS — D72819 Decreased white blood cell count, unspecified: Secondary | ICD-10-CM | POA: Diagnosis not present

## 2018-12-15 DIAGNOSIS — M899 Disorder of bone, unspecified: Secondary | ICD-10-CM | POA: Insufficient documentation

## 2018-12-15 DIAGNOSIS — G8929 Other chronic pain: Secondary | ICD-10-CM

## 2018-12-15 DIAGNOSIS — R200 Anesthesia of skin: Secondary | ICD-10-CM

## 2018-12-15 DIAGNOSIS — R202 Paresthesia of skin: Secondary | ICD-10-CM

## 2018-12-15 DIAGNOSIS — Z719 Counseling, unspecified: Secondary | ICD-10-CM

## 2018-12-15 DIAGNOSIS — R937 Abnormal findings on diagnostic imaging of other parts of musculoskeletal system: Secondary | ICD-10-CM

## 2018-12-15 LAB — CMP (CANCER CENTER ONLY)
ALT: 12 U/L (ref 0–44)
AST: 19 U/L (ref 15–41)
Albumin: 4.5 g/dL (ref 3.5–5.0)
Alkaline Phosphatase: 57 U/L (ref 38–126)
Anion gap: 6 (ref 5–15)
BUN: 6 mg/dL (ref 6–20)
CO2: 27 mmol/L (ref 22–32)
Calcium: 9.1 mg/dL (ref 8.9–10.3)
Chloride: 106 mmol/L (ref 98–111)
Creatinine: 0.83 mg/dL (ref 0.44–1.00)
GFR, Est AFR Am: >60 mL/min
GFR, Estimated: >60 mL/min
Glucose, Bld: 100 mg/dL — ABNORMAL HIGH (ref 70–99)
Potassium: 4.7 mmol/L (ref 3.5–5.1)
Sodium: 139 mmol/L (ref 135–145)
Total Bilirubin: 0.6 mg/dL (ref 0.3–1.2)
Total Protein: 7.3 g/dL (ref 6.5–8.1)

## 2018-12-15 LAB — CBC WITH DIFFERENTIAL (CANCER CENTER ONLY)
Abs Immature Granulocytes: 0 10*3/uL (ref 0.00–0.07)
Basophils Absolute: 0 10*3/uL (ref 0.0–0.1)
Basophils Relative: 2 %
Eosinophils Absolute: 0.1 10*3/uL (ref 0.0–0.5)
Eosinophils Relative: 4 %
HCT: 38.9 % (ref 36.0–46.0)
Hemoglobin: 12.5 g/dL (ref 12.0–15.0)
Immature Granulocytes: 0 %
Lymphocytes Relative: 49 %
Lymphs Abs: 1.3 10*3/uL (ref 0.7–4.0)
MCH: 30.6 pg (ref 26.0–34.0)
MCHC: 32.1 g/dL (ref 30.0–36.0)
MCV: 95.3 fL (ref 80.0–100.0)
Monocytes Absolute: 0.2 10*3/uL (ref 0.1–1.0)
Monocytes Relative: 9 %
Neutro Abs: 1 10*3/uL — ABNORMAL LOW (ref 1.7–7.7)
Neutrophils Relative %: 36 %
Platelet Count: 257 10*3/uL (ref 150–400)
RBC: 4.08 MIL/uL (ref 3.87–5.11)
RDW: 12.8 % (ref 11.5–15.5)
WBC Count: 2.7 10*3/uL — ABNORMAL LOW (ref 4.0–10.5)
nRBC: 0 % (ref 0.0–0.2)

## 2018-12-15 LAB — SAVE SMEAR(SSMR), FOR PROVIDER SLIDE REVIEW

## 2018-12-15 NOTE — Progress Notes (Signed)
.  Initial RN Navigator Patient Visit  Name: Alexis Brooks Date of Referral : 10/30/2018 (patient cancelled initial appointment) Diagnosis: unknown : sacral lesion  Met with patient prior to their visit with MD. Hanley Seamen patient "Your Patient Navigator" handout which explains my role, areas in which I am able to help, and all the contact information for myself and the office. Also gave patient MD and Navigator business card. Reviewed with patient the general overview of expected course after initial diagnosis and time frame for all steps to be completed.  Patient completed visit with Dr. Maylon Peppers  Dr Maylon Peppers does not believe this lesion to be malignant. Patient will be observed at this time. I will discontinue active navigation follow up.

## 2018-12-15 NOTE — Telephone Encounter (Signed)
Return as needed per 3/6 los

## 2018-12-18 LAB — MULTIPLE MYELOMA PANEL, SERUM
Albumin SerPl Elph-Mcnc: 3.8 g/dL (ref 2.9–4.4)
Albumin/Glob SerPl: 1.3 (ref 0.7–1.7)
Alpha 1: 0.2 g/dL (ref 0.0–0.4)
Alpha2 Glob SerPl Elph-Mcnc: 0.8 g/dL (ref 0.4–1.0)
B-Globulin SerPl Elph-Mcnc: 0.9 g/dL (ref 0.7–1.3)
Gamma Glob SerPl Elph-Mcnc: 1.1 g/dL (ref 0.4–1.8)
Globulin, Total: 3 g/dL (ref 2.2–3.9)
IGA: 242 mg/dL (ref 87–352)
IgG (Immunoglobin G), Serum: 1150 mg/dL (ref 700–1600)
IgM (Immunoglobulin M), Srm: 101 mg/dL (ref 26–217)
Total Protein ELP: 6.8 g/dL (ref 6.0–8.5)

## 2018-12-18 LAB — KAPPA/LAMBDA LIGHT CHAINS
Kappa free light chain: 15 mg/L (ref 3.3–19.4)
Kappa, lambda light chain ratio: 1.2 (ref 0.26–1.65)
Lambda free light chains: 12.5 mg/L (ref 5.7–26.3)

## 2018-12-18 LAB — LACTATE DEHYDROGENASE: LDH: 199 U/L — ABNORMAL HIGH (ref 98–192)

## 2018-12-19 ENCOUNTER — Telehealth: Payer: Self-pay | Admitting: *Deleted

## 2018-12-19 ENCOUNTER — Ambulatory Visit: Payer: BC Managed Care – PPO | Admitting: Family

## 2018-12-19 NOTE — Telephone Encounter (Signed)
As noted below by Dr. Maylon Peppers, I informed the patient that her Myeloma lab panel was normal. She verbalized understanding.

## 2018-12-19 NOTE — Telephone Encounter (Signed)
-----   Message from Tish Men, MD sent at 12/18/2018  4:39 PM EDT ----- Can we let the patient know that her myeloma lab panel was normal? Thanks.  [place] ----- Message ----- From: Buel Ream, Lab [facility] [place] Sent: 12/15/2018   8:26 AM EDT To: Tish Men, MD

## 2019-05-01 ENCOUNTER — Other Ambulatory Visit: Payer: Self-pay

## 2019-05-01 ENCOUNTER — Encounter: Payer: Self-pay | Admitting: Family

## 2019-05-01 ENCOUNTER — Ambulatory Visit: Payer: BC Managed Care – PPO | Admitting: Family

## 2019-05-01 VITALS — BP 108/66 | HR 75 | Temp 98.9°F | Resp 16 | Ht 64.0 in | Wt 150.6 lb

## 2019-05-01 DIAGNOSIS — H6983 Other specified disorders of Eustachian tube, bilateral: Secondary | ICD-10-CM

## 2019-05-01 MED ORDER — FLUTICASONE PROPIONATE 50 MCG/ACT NA SUSP: 2.0000 | Freq: Every day | NASAL | 6 refills | Status: AC
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
## 2019-05-01 NOTE — Patient Instructions (Addendum)
Try adding flonase 2 sprays each nostril once daily.  Call if symptoms worsen or if they are not improved in 1 week.

## 2019-06-11 ENCOUNTER — Encounter: Payer: Self-pay | Admitting: Family

## 2019-06-11 DIAGNOSIS — H9209 Otalgia, unspecified ear: Secondary | ICD-10-CM

## 2019-08-14 ENCOUNTER — Other Ambulatory Visit: Payer: Self-pay

## 2019-08-14 DIAGNOSIS — Z20822 Contact with and (suspected) exposure to covid-19: Secondary | ICD-10-CM

## 2019-08-16 LAB — NOVEL CORONAVIRUS, NAA: SARS-CoV-2, NAA: NOT DETECTED

## 2019-08-28 ENCOUNTER — Other Ambulatory Visit: Payer: Self-pay

## 2019-08-28 DIAGNOSIS — Z20822 Contact with and (suspected) exposure to covid-19: Secondary | ICD-10-CM

## 2019-08-30 LAB — NOVEL CORONAVIRUS, NAA: SARS-CoV-2, NAA: NOT DETECTED

## 2019-09-13 ENCOUNTER — Other Ambulatory Visit: Payer: Self-pay

## 2019-09-13 DIAGNOSIS — Z20822 Contact with and (suspected) exposure to covid-19: Secondary | ICD-10-CM

## 2019-09-15 LAB — NOVEL CORONAVIRUS, NAA: SARS-CoV-2, NAA: NOT DETECTED

## 2019-09-17 ENCOUNTER — Telehealth: Payer: Self-pay

## 2019-09-17 NOTE — Telephone Encounter (Signed)
    Appointment For: Alexis Brooks ([id number]) Visit Type: OFFICE VISIT (1004)  09/19/2019   12:20 PM  20 mins.  Debbrah Alar, NP   [facility]  Patient Comments: I have been experiencing a dull ache in my chest on and off. The first time happened over two weeks ago and I didn't feel anything for a week. Last week, it started again. Sometimes I feel it on the right side, other times it is my left. It comes and goes throughout the day. I have not noticed any behaviors/actions that makes it worse. For example, I workout regularly and don't feel out of breath during or after. I initially thought it was gas but it hasn't gone away.   Called patient back about this information and she was advised to seek in person evaluation today for chest "dull ache, on and off". She understands. If she decides not to go to the er or urgent care she will call in am for office visit for atypical chest pains,

## 2019-09-18 ENCOUNTER — Ambulatory Visit: Payer: Self-pay | Admitting: Family

## 2019-09-18 ENCOUNTER — Other Ambulatory Visit: Payer: Self-pay

## 2019-09-18 ENCOUNTER — Ambulatory Visit: Payer: BC Managed Care – PPO | Admitting: Family Medicine

## 2019-09-18 ENCOUNTER — Encounter: Payer: Self-pay | Admitting: Family Medicine

## 2019-09-18 VITALS — BP 92/62 | HR 72 | Temp 97.7°F | Ht 64.0 in | Wt 158.4 lb

## 2019-09-18 DIAGNOSIS — R0789 Other chest pain: Secondary | ICD-10-CM | POA: Diagnosis not present

## 2019-09-18 NOTE — Telephone Encounter (Signed)
Did not go to er or urgent care, was seen by Dr Nani Ravens today

## 2019-09-18 NOTE — Telephone Encounter (Signed)
Please see MyChart message from yesterday. Pt reports did not go to ED/UC last night as did not have episode of CP. Reports "Dull" intermittent CP, location varies, "Sometimes left, at times right." Denies any other symptoms; no nausea, dizziness, diaphoresis. Pain does not radiate, no SOB. Does not recall any recent injury.  States she exercises every day. Pt would like in office visit. CAll placed to practice, Drue Dun for consideration of appt within 24hrs. Care advise given per protocol, pt verbalizes understanding. Reason for Disposition . [1] Chest pain lasting < 5 minutes AND [2] NO chest pain or cardiac symptoms (e.g., breathing difficulty, sweating) now  Answer Assessment - Initial Assessment Questions 1. LOCATION: "Where does it hurt?"       VAries, left, sometimes right 2. RADIATION: "Does the pain go anywhere else?" (e.g., into neck, jaw, arms, back)     no 3. ONSET: "When did the chest pain begin?" (Minutes, hours or days)      2 weeks ago 4. PATTERN "Does the pain come and go, or has it been constant since it started?"  "Does it get worse with exertion?"      Intermittent 5. DURATION: "How long does it last" (e.g., seconds, minutes, hours)      6. SEVERITY: "How bad is the pain?"  (e.g., Scale 1-10; mild, moderate, or severe)    - MILD (1-3): doesn't interfere with normal activities     - MODERATE (4-7): interferes with normal activities or awakens from sleep    - SEVERE (8-10): excruciating pain, unable to do any normal activities      dull 7. CARDIAC RISK FACTORS: "Do you have any history of heart problems or risk factors for heart disease?" (e.g., angina, prior heart attack; diabetes, high blood pressure, high cholesterol, smoker, or strong family history of heart disease)    no 8. PULMONARY RISK FACTORS: "Do you have any history of lung disease?"  (e.g., blood clots in lung, asthma, emphysema, birth control pills)     no 9. CAUSE: "What do you think is causing the chest  pain?"     unsure 10. OTHER SYMPTOMS: "Do you have any other symptoms?" (e.g., dizziness, nausea, vomiting, sweating, fever, difficulty breathing, cough)       no  Protocols used: CHEST PAIN-A-AH
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
## 2019-09-18 NOTE — Patient Instructions (Addendum)
This does not sound concerning for heart or lung issues.   Heat (pad or rice pillow in microwave) over affected area, 10-15 minutes twice daily.   OK to take Tylenol 1000 mg (2 extra strength tabs) or 975 mg (3 regular strength tabs) every 6 hours as needed.  Let us know if you need anything.   Pectoralis stretches/exercises It is normal to feel mild stretching, pulling, tightness, or discomfort as you do these exercises, but you should stop right away if you feel sudden pain or your pain gets worse.Do not begin these exercises until told by your health care provider. Stretching and range of motion exercises These exercises warm up your muscles and joints and improve the movement and flexibility of your shoulder. These exercises can also help to relieve pain, numbness, and tingling. Exercise A: Pendulum  1. Stand near a wall or a surface that you can hold onto for balance. 2. Bend at the waist and let your left / right arm hang straight down. Use your other arm to keep your balance. 3. Relax your arm and shoulder muscles, and move your hips and your trunk so your left / right arm swings freely. Your arm should swing because of the motion of your body, not because you are using your arm or shoulder muscles. 4. Keep moving so your arm swings in the following directions, as told by your health care provider: ? Side to side. ? Forward and backward. ? In clockwise and counterclockwise circles. 5. Slowly return to the starting position. Repeat 2 times. Complete this exercise 3 times per week. Exercise B: Abduction, standing 1. Stand and hold a broomstick, a cane, or a similar object. Place your hands a little more than shoulder-width apart on the object. Your left / right hand should be palm-up, and your other hand should be palm-down. 2. While keeping your elbow straight and your shoulder muscles relaxed, push the stick across your body toward your left / right side. Raise your left / right arm  to the side of your body and then over your head until you feel a stretch in your shoulder. ? Stop when you reach the angle that is recommended by your health care provider. ? Avoid shrugging your shoulder while you raise your arm. Keep your shoulder blade tucked down toward the middle of your spine. 3. Hold for 10 seconds. 4. Slowly return to the starting position. Repeat 2 times. Complete this exercise 3 times per week. Exercise C: Wand flexion, supine  1. Lie on your back. You may bend your knees for comfort. 2. Hold a broomstick, a cane, or a similar object so that your hands are about shoulder-width apart on the object. Your palms should face toward your feet. 3. Raise your left / right arm in front of your face, then behind your head (toward the floor). Use your other hand to help you do this. Stop when you feel a gentle stretch in your shoulder, or when you reach the angle that is recommended by your health care provider. 4. Hold for 3 seconds. 5. Use the broomstick and your other arm to help you return your left / right arm to the starting position. Repeat 2 times. Complete this exercise 3 times per week. Exercise D: Wand shoulder external rotation 1. Stand and hold a broomstick, a cane, or a similar object so your handsare about shoulder-width apart on the object. 2. Start with your arms hanging down, then bend both elbows to an "L" shape (  90 degrees). 3. Keep your left / right elbow at your side. Use your other hand to push the stick so your left / right forearm moves away from your body, out to your side. ? Keep your left / right elbow bent to 90 degrees and keep it against your side. ? Stop when you feel a gentle stretch in your shoulder, or when you reach the angle recommended by your health care provider. 4. Hold for 10 seconds. 5. Use the stick to help you return your left / right arm to the starting position. Repeat 2 times. Complete this exercise 3 times per  week. Strengthening exercises These exercises build strength and endurance in your shoulder. Endurance is the ability to use your muscles for a long time, even after your muscles get tired. Exercise E: Scapular protraction, standing 1. Stand so you are facing a wall. Place your feet about one arm-length away from the wall. 2. Place your hands on the wall and straighten your elbows. 3. Keep your hands on the wall as you push your upper back away from the wall. You should feel your shoulder blades sliding forward.Keep your elbows and your head still. ? If you are not sure that you are doing this exercise correctly, ask your health care provider for more instructions. 4. Hold for 3 seconds. 5. Slowly return to the starting position. Let your muscles relax completely before you repeat this exercise. Repeat 2 times. Complete this exercise 3 times per week. Exercise F: Shoulder blade squeezes  (scapular retraction) 1. Sit with good posture in a stable chair. Do not let your back touch the back of the chair. 2. Your arms should be at your sides with your elbows bent. You may rest your forearms on a pillow if that is more comfortable. 3. Squeeze your shoulder blades together. Bring them down and back. ? Keep your shoulders level. ? Do not lift your shoulders up toward your ears. 4. Hold for 3 seconds. 5. Return to the starting position. Repeat 2 times. Complete this exercise 3 times per week. Make sure you discuss any questions you have with your health care provider. Document Released: 09/27/2005 Document Revised: 07/08/2016 Document Reviewed: 06/15/2015 Elsevier Interactive Patient Education  [employer].

## 2019-09-18 NOTE — Telephone Encounter (Signed)
Was seen by Dr Nani Ravens today

## 2019-09-19 ENCOUNTER — Ambulatory Visit: Payer: BC Managed Care – PPO | Admitting: Family

## 2019-11-14 ENCOUNTER — Ambulatory Visit (INDEPENDENT_AMBULATORY_CARE_PROVIDER_SITE_OTHER): Payer: BC Managed Care – PPO | Admitting: Neurology

## 2019-11-14 ENCOUNTER — Other Ambulatory Visit: Payer: Self-pay

## 2019-11-14 ENCOUNTER — Ambulatory Visit: Payer: BC Managed Care – PPO | Admitting: Neurology

## 2019-11-14 ENCOUNTER — Encounter: Payer: Self-pay | Admitting: Neurology

## 2019-11-14 DIAGNOSIS — M5431 Sciatica, right side: Secondary | ICD-10-CM

## 2019-11-14 NOTE — Progress Notes (Signed)
Please refer to EMG and nerve conduction procedure note.  

## 2019-11-14 NOTE — Procedures (Signed)
     HISTORY:  Alexis Brooks is a 34 year old patient with a 1 year history of back pain and right leg sciatica.  The patient reports no significant weakness of the right leg but she does have some numbness and tingling going down the leg.  She is being evaluated for a possible neuropathy or a radiculopathy.  NERVE CONDUCTION STUDIES:  Nerve conduction studies were performed on the right lower extremity. The distal motor latencies and motor amplitudes for the peroneal and posterior tibial nerves were within normal limits. The nerve conduction velocities for these nerves were also normal. The sensory latencies for the peroneal and sural nerves were within normal limits. The F wave latency for the posterior tibial nerve was within normal limits.   EMG STUDIES:  EMG study was performed on the right lower extremity:  The tibialis anterior muscle reveals 2 to 4K motor units with full recruitment. No fibrillations or positive waves were seen. The peroneus tertius muscle reveals 2 to 4K motor units with full recruitment. No fibrillations or positive waves were seen. The medial gastrocnemius muscle reveals 1 to 3K motor units with full recruitment.  A mild increase in insertional activity was noted, 1 brief run of positive waves was seen. The vastus lateralis muscle reveals 2 to 4K motor units with full recruitment. No fibrillations or positive waves were seen. The iliopsoas muscle reveals 2 to 4K motor units with full recruitment. No fibrillations or positive waves were seen. The biceps femoris muscle (long head) reveals 2 to 4K motor units with full recruitment. No fibrillations or positive waves were seen. The lumbosacral paraspinal muscles were tested at 3 levels, and revealed no abnormalities of insertional activity at all 3 levels tested. There was good relaxation.   IMPRESSION:  Nerve conduction studies done on the right lower extremity were unremarkable, no evidence of a neuropathy is  seen.  EMG evaluation of the right lower extremity was relatively unremarkable with exception of mild isolated irritability of muscle membranes seen in the right gastrocnemius muscle.  The clinical significance of this is not clear, could potentially be related to a very low-grade right S1 radiculopathy.  Clinical correlation is required.  Jill Alexanders MD 11/14/2019 1:45 PM  [facility] [address] [place], [place] [address]  Phone [phone] Fax [phone]

## 2019-11-14 NOTE — Progress Notes (Signed)
[facility]    Nerve / Sites Muscle Latency Ref. Amplitude Ref. Rel Amp Segments Distance Velocity Ref. Area    ms ms mV mV %  cm m/s m/s mVms  R Peroneal - EDB     Ankle EDB 4.5 ?6.5 6.2 ?2.0 100 Ankle - EDB 9   20.9     Fib head EDB 10.4  5.9  96.4 Fib head - Ankle 29 50 ?44 20.5     Pop fossa EDB 12.4  7.1  119 Pop fossa - Fib head 10 50 ?44 23.5     Acc Peron EDB 5.2  4.1  58.2 Pop fossa - Ankle    7.7         Acc Peron - Pop fossa      R Tibial - AH     Ankle AH 4.4 ?5.8 23.0 ?4.0 100 Ankle - AH 9   41.3     Pop fossa AH 12.9  16.6  72.2 Pop fossa - Ankle 39 46 ?41 35.1         SNC    Nerve / Sites Rec. Site Peak Lat Ref.  Amp Ref. Segments Distance    ms ms V V  cm  R Sural - Ankle (Calf)     Calf Ankle 3.5 ?4.4 13 ?6 Calf - Ankle 14  R Superficial peroneal - Ankle     Lat leg Ankle 3.6 ?4.4 8 ?6 Lat leg - Ankle 14         F  Wave    Nerve F Lat Ref.   ms ms  R Tibial - AH 47.2 ?56.0

## 2019-12-12 ENCOUNTER — Encounter: Payer: BC Managed Care – PPO | Admitting: Neurology

## 2020-06-23 IMAGING — CT CT PELVIS W/O CM
1 series · 16 of 32 positions shown, 20 images · non-contrast
Comparison: MRI lumbar spine dated July 19, 2018.

CLINICAL DATA: Chronic low back pain.  Sacral lesion seen on MRI.

EXAM:
CT PELVIS WITHOUT CONTRAST
TECHNIQUE: Multidetector CT imaging of the pelvis was performed following the
standard protocol without intravenous contrast.

[Series 3: soft tissue pelvis/hip · axial · 0.71mm/px · z∈[-228,+9]mm · 16 of 88 slices shown, 20 images]
[im 6/88  soft-tissue]
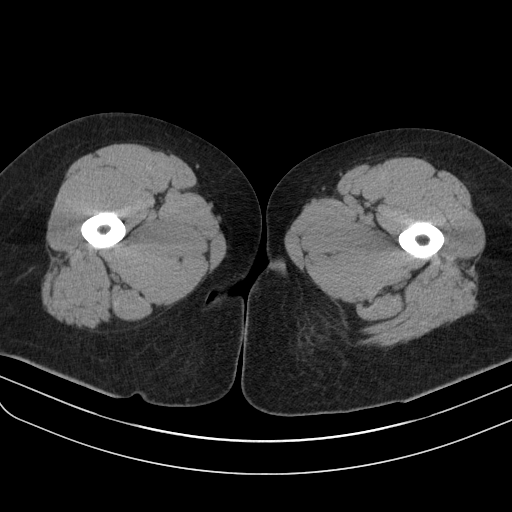
[im 6/88  bone]
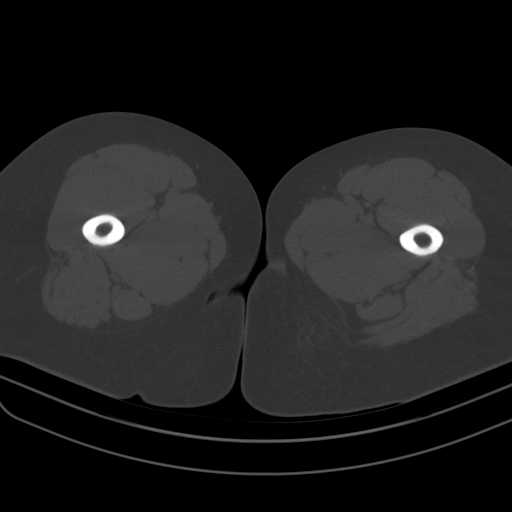
[im 12/88  soft-tissue]
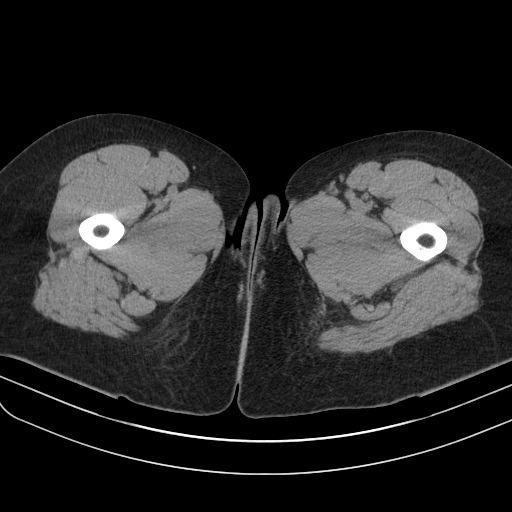
[im 17/88  soft-tissue]
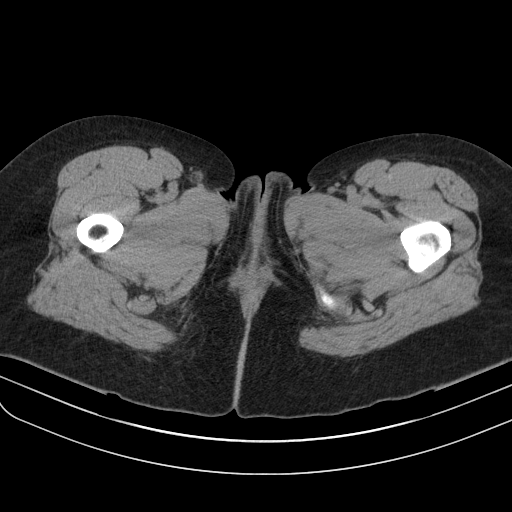
[im 23/88  soft-tissue]
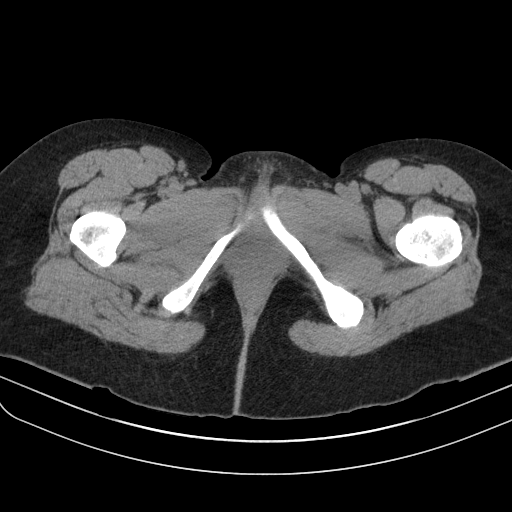
[im 29/88  soft-tissue]
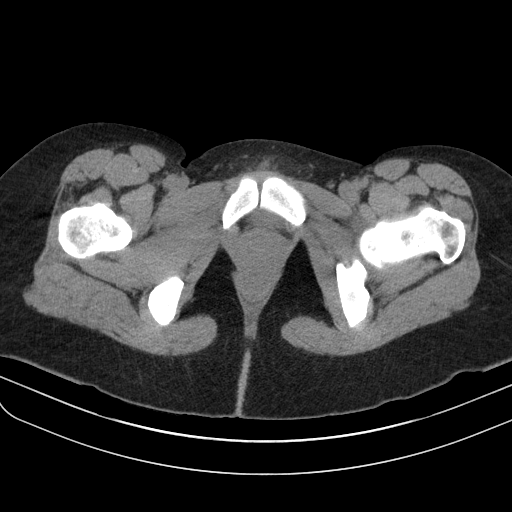
[im 34/88  soft-tissue]
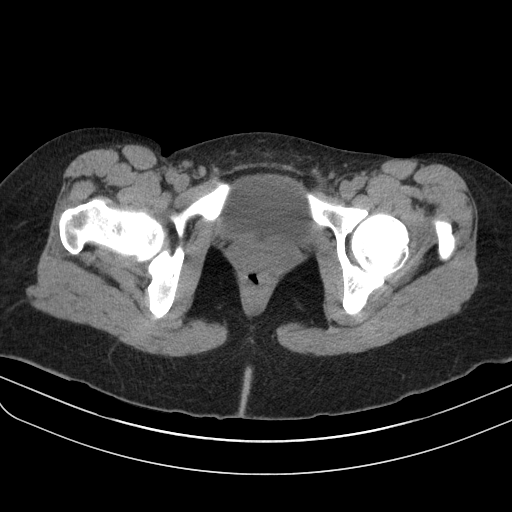
[im 40/88  soft-tissue]
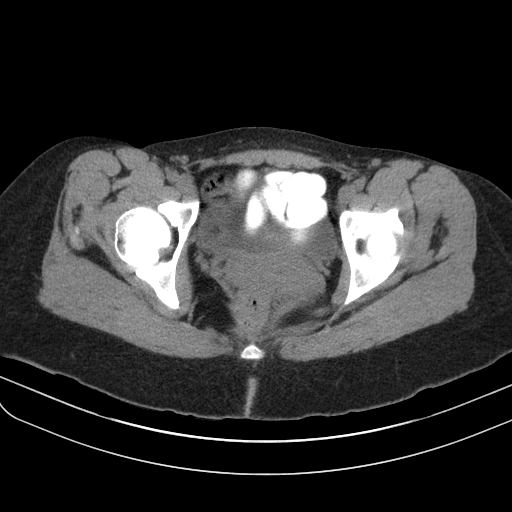
[im 48/88  soft-tissue]
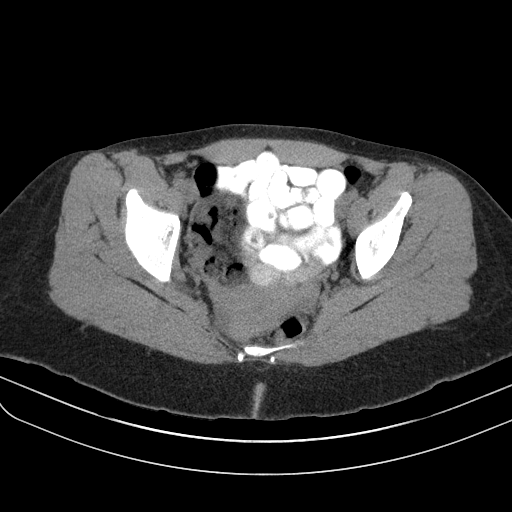
[im 54/88  soft-tissue]
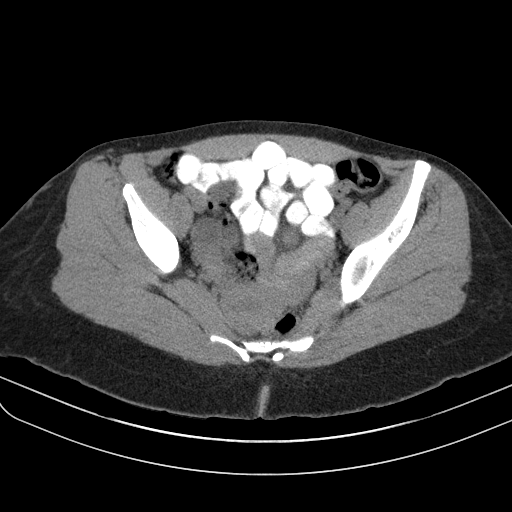
[im 54/88  bone]
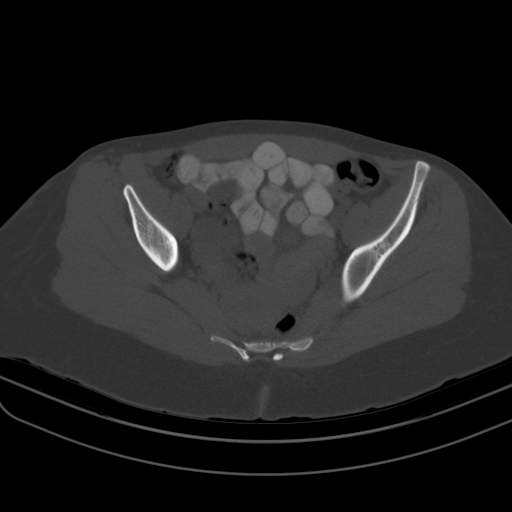
[im 59/88  soft-tissue]
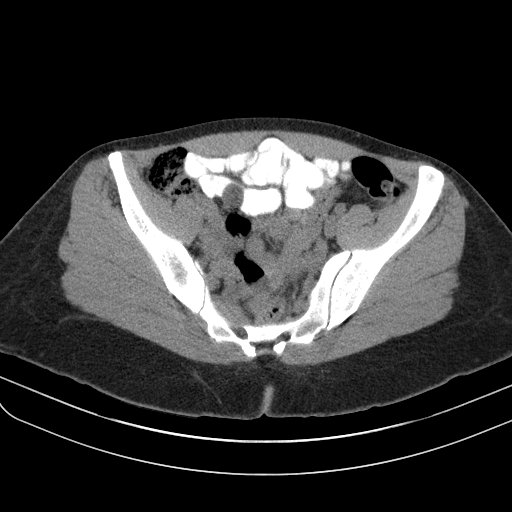
[im 65/88  soft-tissue]
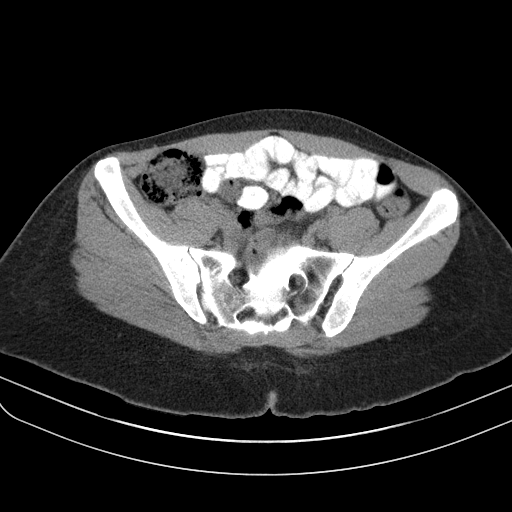
[im 71/88  soft-tissue]
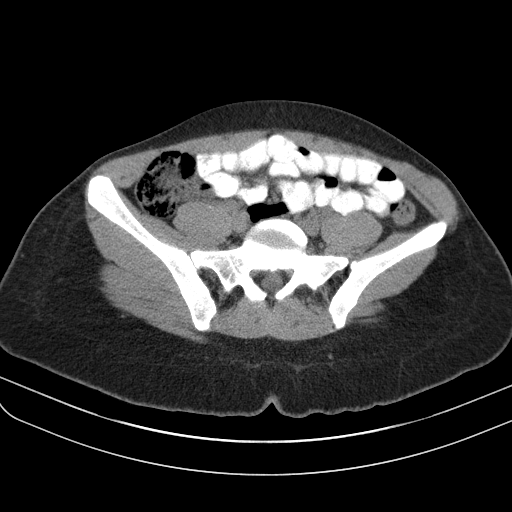
[im 76/88  soft-tissue]
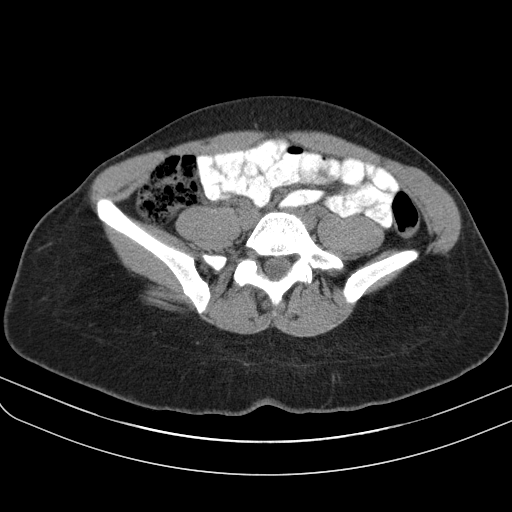
[im 76/88  lung]
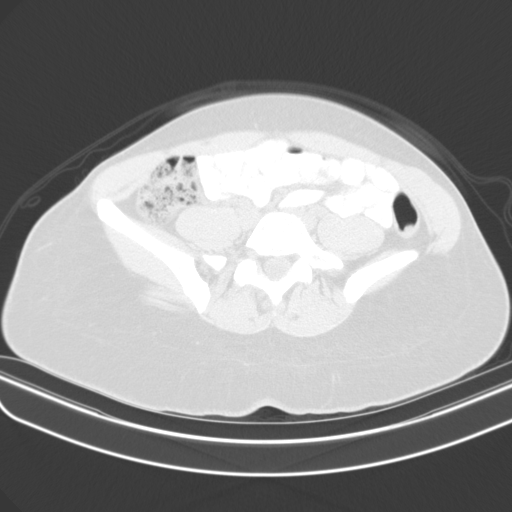
[im 79/88  lung]
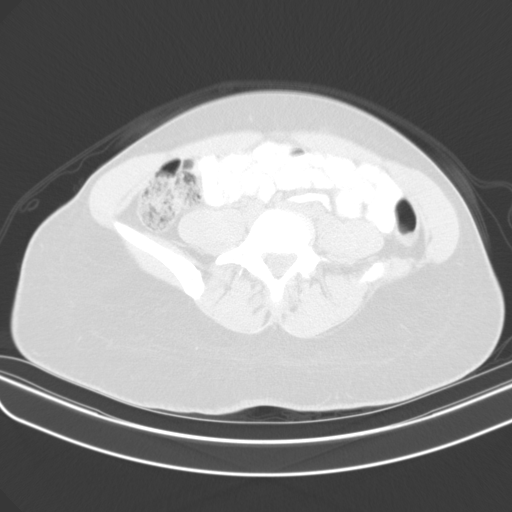
[im 82/88  soft-tissue]
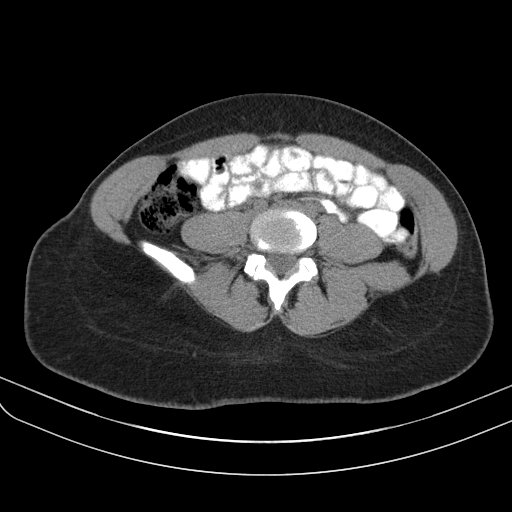
[im 82/88  lung]
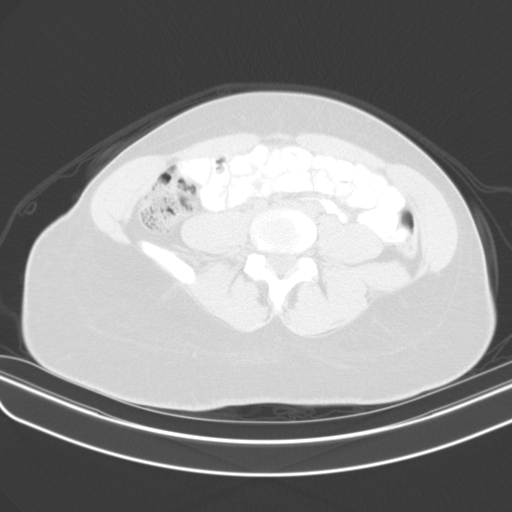
[im 85/88  lung]
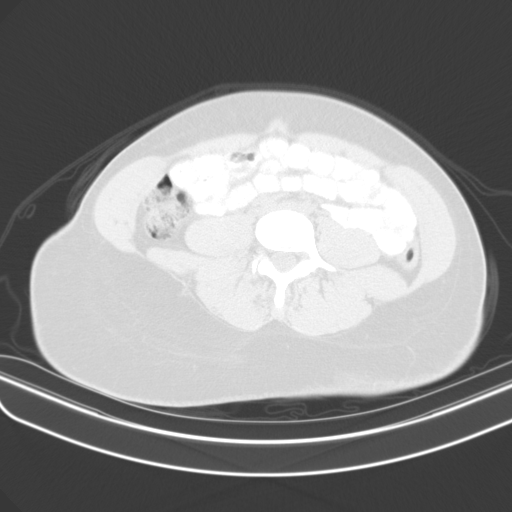

[16 of 32 positions shown; findings below may reference images not displayed]

FINDINGS: Urinary Tract:  No abnormality visualized.

Bowel:  Unremarkable visualized pelvic bowel loops.

Vascular/Lymphatic: No pathologically enlarged lymph nodes. No
significant vascular abnormality seen.

Reproductive:  No mass or other significant abnormality.

Other:  None.

Musculoskeletal: 8 mm oval lucent lesion in the right sacral ala at
the level of S2. No surrounding sclerosis.
IMPRESSION: 1. 8 mm lucent lesion in the right aspect of the sacrum at the S2
level is indeterminate, but worrisome for metastatic disease until
proven otherwise given its appearance on MRI. The differential
diagnosis also includes eosinophilic granuloma. Recommend metastatic
workup as clinically appropriate and follow-up MRI of the sacrum
with and without contrast in 3 months.
